# Patient Record
Sex: Male | Born: 1970 | Race: Black or African American | Hispanic: No | Marital: Married | Smoking: Never smoker
Health system: Southern US, Community
[De-identification: ages and names within clinical notes are randomized; demographics above are authoritative.]

## PROBLEM LIST (undated history)

## (undated) DIAGNOSIS — I10 Essential (primary) hypertension: Secondary | ICD-10-CM

## (undated) HISTORY — PX: KNEE SURGERY: SHX244

---

## 2014-08-09 ENCOUNTER — Encounter (HOSPITAL_COMMUNITY): Payer: Self-pay | Admitting: Emergency Medicine

## 2014-08-09 ENCOUNTER — Emergency Department (HOSPITAL_COMMUNITY): Payer: Self-pay

## 2014-08-09 ENCOUNTER — Emergency Department (HOSPITAL_COMMUNITY)
Admission: EM | Admit: 2014-08-09 | Discharge: 2014-08-09 | Disposition: A | Discharge status: Home / Self Care | Payer: Self-pay | Attending: Emergency Medicine | Admitting: Emergency Medicine

## 2014-08-09 DIAGNOSIS — S61335A Puncture wound without foreign body of left ring finger with damage to nail, initial encounter: Secondary | ICD-10-CM | POA: Insufficient documentation

## 2014-08-09 DIAGNOSIS — S62609B Fracture of unspecified phalanx of unspecified finger, initial encounter for open fracture: Secondary | ICD-10-CM

## 2014-08-09 DIAGNOSIS — Y9389 Activity, other specified: Secondary | ICD-10-CM | POA: Insufficient documentation

## 2014-08-09 DIAGNOSIS — S62637B Displaced fracture of distal phalanx of left little finger, initial encounter for open fracture: Secondary | ICD-10-CM | POA: Insufficient documentation

## 2014-08-09 DIAGNOSIS — Y998 Other external cause status: Secondary | ICD-10-CM | POA: Insufficient documentation

## 2014-08-09 DIAGNOSIS — I10 Essential (primary) hypertension: Secondary | ICD-10-CM | POA: Insufficient documentation

## 2014-08-09 DIAGNOSIS — W298XXA Contact with other powered powered hand tools and household machinery, initial encounter: Secondary | ICD-10-CM | POA: Insufficient documentation

## 2014-08-09 DIAGNOSIS — Y9289 Other specified places as the place of occurrence of the external cause: Secondary | ICD-10-CM | POA: Insufficient documentation

## 2014-08-09 DIAGNOSIS — IMO0002 Reserved for concepts with insufficient information to code with codable children: Secondary | ICD-10-CM

## 2014-08-09 HISTORY — DX: Essential (primary) hypertension: I10

## 2014-08-09 MED ORDER — HYDROCODONE-ACETAMINOPHEN 5-325 MG PO TABS: 2.0000 | ORAL_TABLET | ORAL | Status: AC | PRN

## 2014-08-09 MED ORDER — CEPHALEXIN 500 MG PO CAPS
500.0000 mg | ORAL_CAPSULE | Freq: Once | ORAL | Status: AC
  Administered 2014-08-09: 500 mg via ORAL
  Filled 2014-08-09: qty 1

## 2014-08-09 MED ORDER — LIDOCAINE HCL (PF) 1 % IJ SOLN
INTRAMUSCULAR | Status: AC
  Filled 2014-08-09: qty 10

## 2014-08-09 MED ORDER — CEPHALEXIN 500 MG PO CAPS: 500.0000 mg | ORAL_CAPSULE | Freq: Four times a day (QID) | ORAL | Status: AC

## 2014-08-09 MED ORDER — LIDOCAINE HCL (PF) 1 % IJ SOLN
INTRAMUSCULAR | Status: AC
  Filled 2014-08-09: qty 5

## 2014-08-09 NOTE — ED Notes (Signed)
PT obtained a laceration to right hand 4th and 5th digit with bleeding controlled at this time. PT states has had tetanus shot within the past 5 years.

## 2014-08-09 NOTE — Discharge Instructions (Signed)
Call Dr. Hilda LiasKeeling for follow up appointment. If unable to be seen, return to ER in 10 days.   Finger Fracture Fractures of fingers are breaks in the bones of the fingers. There are many types of fractures. There are different ways of treating these fractures. Your health care provider will discuss the best way to treat your fracture. CAUSES Traumatic injury is the main cause of broken fingers. These include:  Injuries while playing sports.  Workplace injuries.  Falls. RISK FACTORS Activities that can increase your risk of finger fractures include:  Sports.  Workplace activities that involve machinery.  A condition called osteoporosis, which can make your bones less dense and cause them to fracture more easily. SIGNS AND SYMPTOMS The main symptoms of a broken finger are pain and swelling within 15 minutes after the injury. Other symptoms include:  Bruising of your finger.  Stiffness of your finger.  Numbness of your finger.  Exposed bones (compound fracture) if the fracture is severe. DIAGNOSIS  The best way to diagnose a broken bone is with X-ray imaging. Additionally, your health care provider will use this X-ray image to evaluate the position of the broken finger bones.  TREATMENT  Finger fractures can be treated with:   Nonreduction--This means the bones are in place. The finger is splinted without changing the positions of the bone pieces. The splint is usually left on for about a week to 10 days. This will depend on your fracture and what your health care provider thinks.  Closed reduction--The bones are put back into position without using surgery. The finger is then splinted.  Open reduction and internal fixation--The fracture site is opened. Then the bone pieces are fixed into place with pins or some type of hardware. This is seldom required. It depends on the severity of the fracture. HOME CARE INSTRUCTIONS   Follow your health care provider's instructions regarding  activities, exercises, and physical therapy.  Only take over-the-counter or prescription medicines for pain, discomfort, or fever as directed by your health care provider. SEEK MEDICAL CARE IF: You have pain or swelling that limits the motion or use of your fingers. SEEK IMMEDIATE MEDICAL CARE IF:  Your finger becomes numb. MAKE SURE YOU:   Understand these instructions.  Will watch your condition.  Will get help right away if you are not doing well or get worse. Document Released: 12/06/2000 Document Revised: 06/14/2013 Document Reviewed: 04/05/2013 [facility] Patient Information 2015 [place]. This information is not intended to replace advice given to you by your health care provider. Make sure you discuss any questions you have with your health care provider.  Laceration Care, Adult A laceration is a cut or lesion that goes through all layers of the skin and into the tissue just beneath the skin. TREATMENT  Some lacerations may not require closure. Some lacerations may not be able to be closed due to an increased risk of infection. It is important to see your caregiver as soon as possible after an injury to minimize the risk of infection and maximize the opportunity for successful closure. If closure is appropriate, pain medicines may be given, if needed. The wound will be cleaned to help prevent infection. Your caregiver will use stitches (sutures), staples, wound glue (adhesive), or skin adhesive strips to repair the laceration. These tools bring the skin edges together to allow for faster healing and a better cosmetic outcome. However, all wounds will heal with a scar. Once the wound has healed, scarring can be minimized by  covering the wound with sunscreen during the day for 1 full year. HOME CARE INSTRUCTIONS  For sutures or staples:  Keep the wound clean and dry.  If you were given a bandage (dressing), you should change it at least once a day. Also, change the dressing if  it becomes wet or dirty, or as directed by your caregiver.  Wash the wound with soap and water 2 times a day. Rinse the wound off with water to remove all soap. Pat the wound dry with a clean towel.  After cleaning, apply a thin layer of the antibiotic ointment as recommended by your caregiver. This will help prevent infection and keep the dressing from sticking.  You may shower as usual after the first 24 hours. Do not soak the wound in water until the sutures are removed.  Only take over-the-counter or prescription medicines for pain, discomfort, or fever as directed by your caregiver.  Get your sutures or staples removed as directed by your caregiver. For skin adhesive strips:  Keep the wound clean and dry.  Do not get the skin adhesive strips wet. You may bathe carefully, using caution to keep the wound dry.  If the wound gets wet, pat it dry with a clean towel.  Skin adhesive strips will fall off on their own. You may trim the strips as the wound heals. Do not remove skin adhesive strips that are still stuck to the wound. They will fall off in time. For wound adhesive:  You may briefly wet your wound in the shower or bath. Do not soak or scrub the wound. Do not swim. Avoid periods of heavy perspiration until the skin adhesive has fallen off on its own. After showering or bathing, gently pat the wound dry with a clean towel.  Do not apply liquid medicine, cream medicine, or ointment medicine to your wound while the skin adhesive is in place. This may loosen the film before your wound is healed.  If a dressing is placed over the wound, be careful not to apply tape directly over the skin adhesive. This may cause the adhesive to be pulled off before the wound is healed.  Avoid prolonged exposure to sunlight or tanning lamps while the skin adhesive is in place. Exposure to ultraviolet light in the first year will darken the scar.  The skin adhesive will usually remain in place for 5 to  10 days, then naturally fall off the skin. Do not pick at the adhesive film. You may need a tetanus shot if:  You cannot remember when you had your last tetanus shot.  You have never had a tetanus shot. If you get a tetanus shot, your arm may swell, get red, and feel warm to the touch. This is common and not a problem. If you need a tetanus shot and you choose not to have one, there is a rare chance of getting tetanus. Sickness from tetanus can be serious. SEEK MEDICAL CARE IF:   You have redness, swelling, or increasing pain in the wound.  You see a red line that goes away from the wound.  You have yellowish-white fluid (pus) coming from the wound.  You have a fever.  You notice a bad smell coming from the wound or dressing.  Your wound breaks open before or after sutures have been removed.  You notice something coming out of the wound such as wood or glass.  Your wound is on your hand or foot and you cannot move a finger  or toe. SEEK IMMEDIATE MEDICAL CARE IF:   Your pain is not controlled with prescribed medicine.  You have severe swelling around the wound causing pain and numbness or a change in color in your arm, hand, leg, or foot.  Your wound splits open and starts bleeding.  You have worsening numbness, weakness, or loss of function of any joint around or beyond the wound.  You develop painful lumps near the wound or on the skin anywhere on your body. MAKE SURE YOU:   Understand these instructions.  Will watch your condition.  Will get help right away if you are not doing well or get worse. Document Released: 08/24/2005 Document Revised: 11/16/2011 Document Reviewed: 02/17/2011 [facility] Patient Information 2015 [place], [place]. This information is not intended to replace advice given to you by your health care provider. Make sure you discuss any questions you have with your health care provider.

## 2014-08-09 NOTE — ED Notes (Signed)
Patient with no complaints at this time. Respirations even and unlabored. Skin warm/dry. Discharge instructions reviewed with patient at this time. Patient given opportunity to voice concerns/ask questions. Patient discharged at this time and left Emergency Department with steady gait.   

## 2014-08-09 NOTE — ED Provider Notes (Signed)
CSN: [id number]     Arrival date & time 08/09/14  1511 History  This chart was scribed for Rolland PorterMark Shardae Kleinman, MD by Annye AsaAnna Dorsett, ED Scribe. This patient was seen in room APA19/APA19 and the patient's care was started at 3:35 PM.    Chief Complaint  Patient presents with  . Extremity Laceration   The history is provided by the patient. No language interpreter was used.     HPI Comments: David Campos is a 43 y.o. male who is right hand dominant presents to the Emergency Department complaining of a laceration to the left fourth and fifth digit. Bleeding is controlled at present. He explains that he was utilizing a hand held drill with his right hand when he accidentally caught his left hand under the edge of the drill and "snatched it away."  Patient reports that his last TDP was in 2012.   Past Medical History  Diagnosis Date  . Hypertension    Past Surgical History  Procedure Laterality Date  . Knee surgery     No family history on file. History  Substance Use Topics  . Smoking status: Never Smoker   . Smokeless tobacco: Not on file  . Alcohol Use: Yes     Comment: beer daily    Review of Systems  Skin: Positive for wound (left fourth and fifth fingers).    Allergies  Review of patient's allergies indicates no known allergies.  Home Medications   Prior to Admission medications   Not on File   BP 194/124 mmHg  Pulse 76  Temp(Src) 99 F (37.2 C) (Oral)  Resp 18  Ht 5\' 11"  (1.803 m)  Wt 240 lb (108.863 kg)  BMI 33.49 kg/m2  SpO2 97% Physical Exam  Constitutional: He is oriented to person, place, and time. He appears well-developed and well-nourished.  HENT:  Head: Normocephalic and atraumatic.  Neck: No tracheal deviation present.  Cardiovascular: Normal rate.   Pulmonary/Chest: Effort normal.  Neurological: He is alert and oriented to person, place, and time.  Skin: Skin is warm and dry.  Decreased sensation to the tip of the left small finger Puncture to the  nail of the left ring finger; motion and sensation intact Jagged laceration to the left fifth radial DIP; FDP intact, flexion intact  Psychiatric: He has a normal mood and affect. His behavior is normal.  Nursing note and vitals reviewed.   ED Course  LACERATION REPAIR Date/Time: 08/09/2014 5:24 PM Performed by: Rolland PorterJAMES, Shamyia Grandpre Authorized by: Rolland PorterJAMES, Mischell Branford Consent: Verbal consent obtained. Written consent not obtained. Consent given by: patient Patient understanding: patient states understanding of the procedure being performed Patient identity confirmed: verbally with patient Body area: upper extremity Location details: left small finger Laceration length: 3 cm Foreign body present: cancellous bone fragment in nonviable soft tissues debrieded. Tendon involvement: none Nerve involvement: none Vascular damage: no Anesthesia: digital block Local anesthetic: lidocaine 1% without epinephrine Anesthetic total: 6 ml Patient sedated: no Irrigation solution: saline Irrigation method: syringe Amount of cleaning: 1 liter. Debridement: minimal (1x202mm piece cancellous bone) Degree of undermining: none Skin closure: 4-0 nylon Number of sutures: 12 Technique: simple Approximation difficulty: complex Dressing: 4x4 sterile gauze Patient tolerance: Patient tolerated the procedure well with no immediate complications     DIAGNOSTIC STUDIES: Oxygen Saturation is 97% on RA, adequate by my interpretation.    COORDINATION OF CARE: 3:38 PM Discussed treatment plan with pt at bedside and pt agreed to plan.    Labs Review Labs Reviewed - No  data to display  Imaging Review No results found.   EKG Interpretation None      MDM   Final diagnoses:  Laceration   X rays noted.  After anesthesia, pt 5th digit does not show instability at PIP to suggest bony avulsion of radial-collateral PIP ligament.  Intact tendon function. Slight decreased sensation to 2 point to radial lateral P3.   I  personally performed the services described in this documentation, which was scribed in my presence. The recorded information has been reviewed and is accurate.    EKG Interpretation None          Rolland PorterMark Edwina Grossberg, MD 08/09/14 1732

## 2014-08-09 NOTE — ED Notes (Signed)
Wound dressed with sterile non-adherent dressing and secured with tube gauze and kerlex. Wound care instructions as well as follow up care given.

## 2015-09-07 IMAGING — CR DG HAND COMPLETE 3+V*L*
3 series · 3 of 3 positions shown · non-contrast
Comparison: None.

CLINICAL DATA: Lacerations to the ring and little fingers.

EXAM:
LEFT HAND - COMPLETE 3+ VIEW

[view not recorded (1 of 3)]
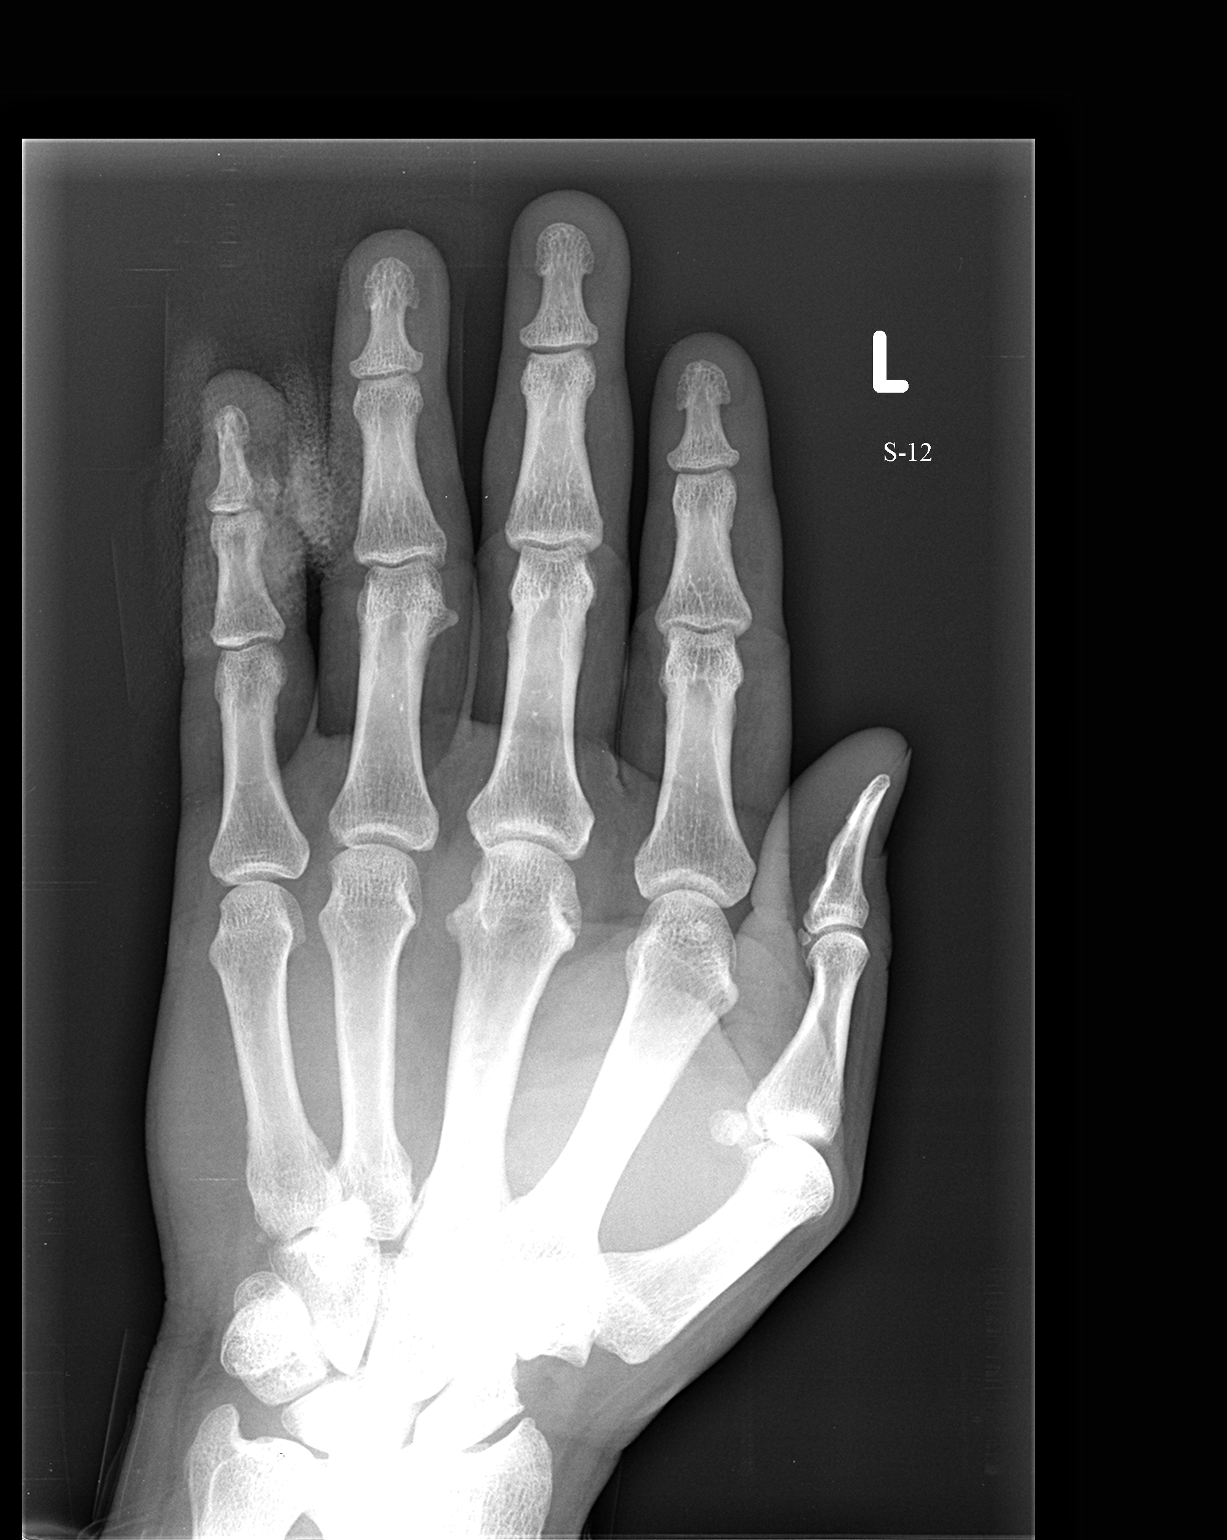

[view not recorded (2 of 3)]
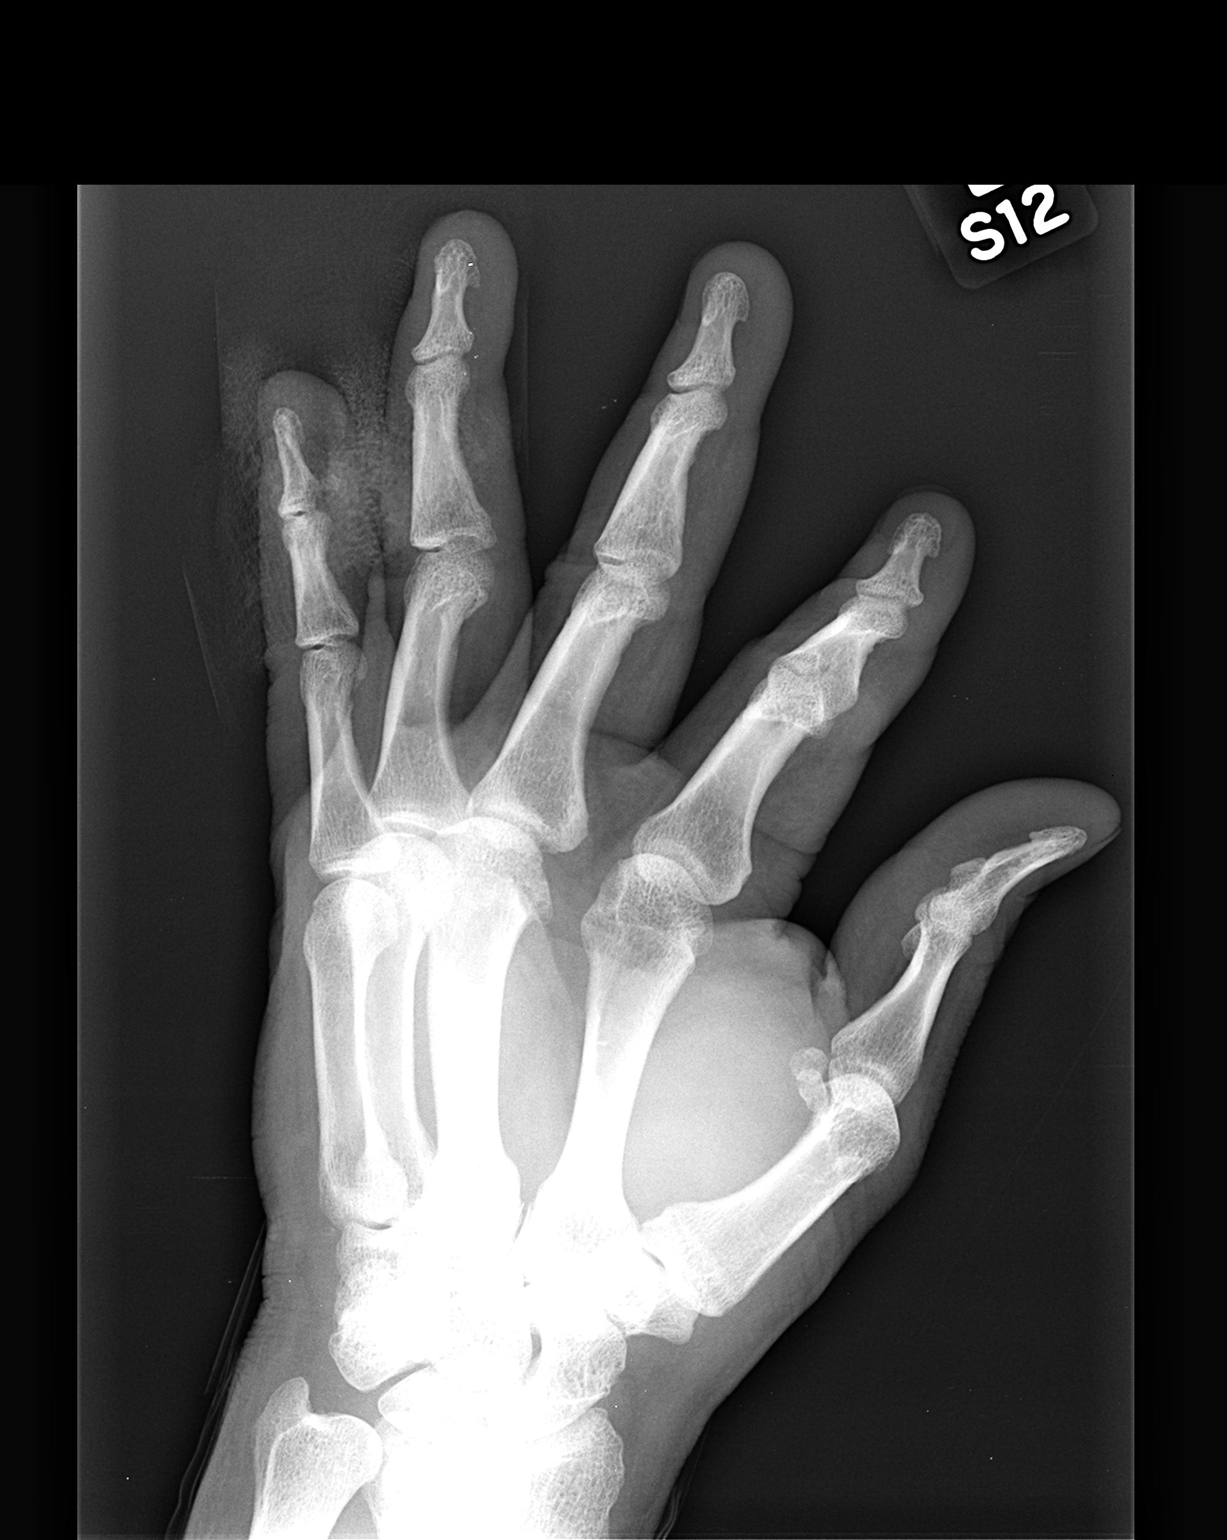

[view not recorded (3 of 3)]
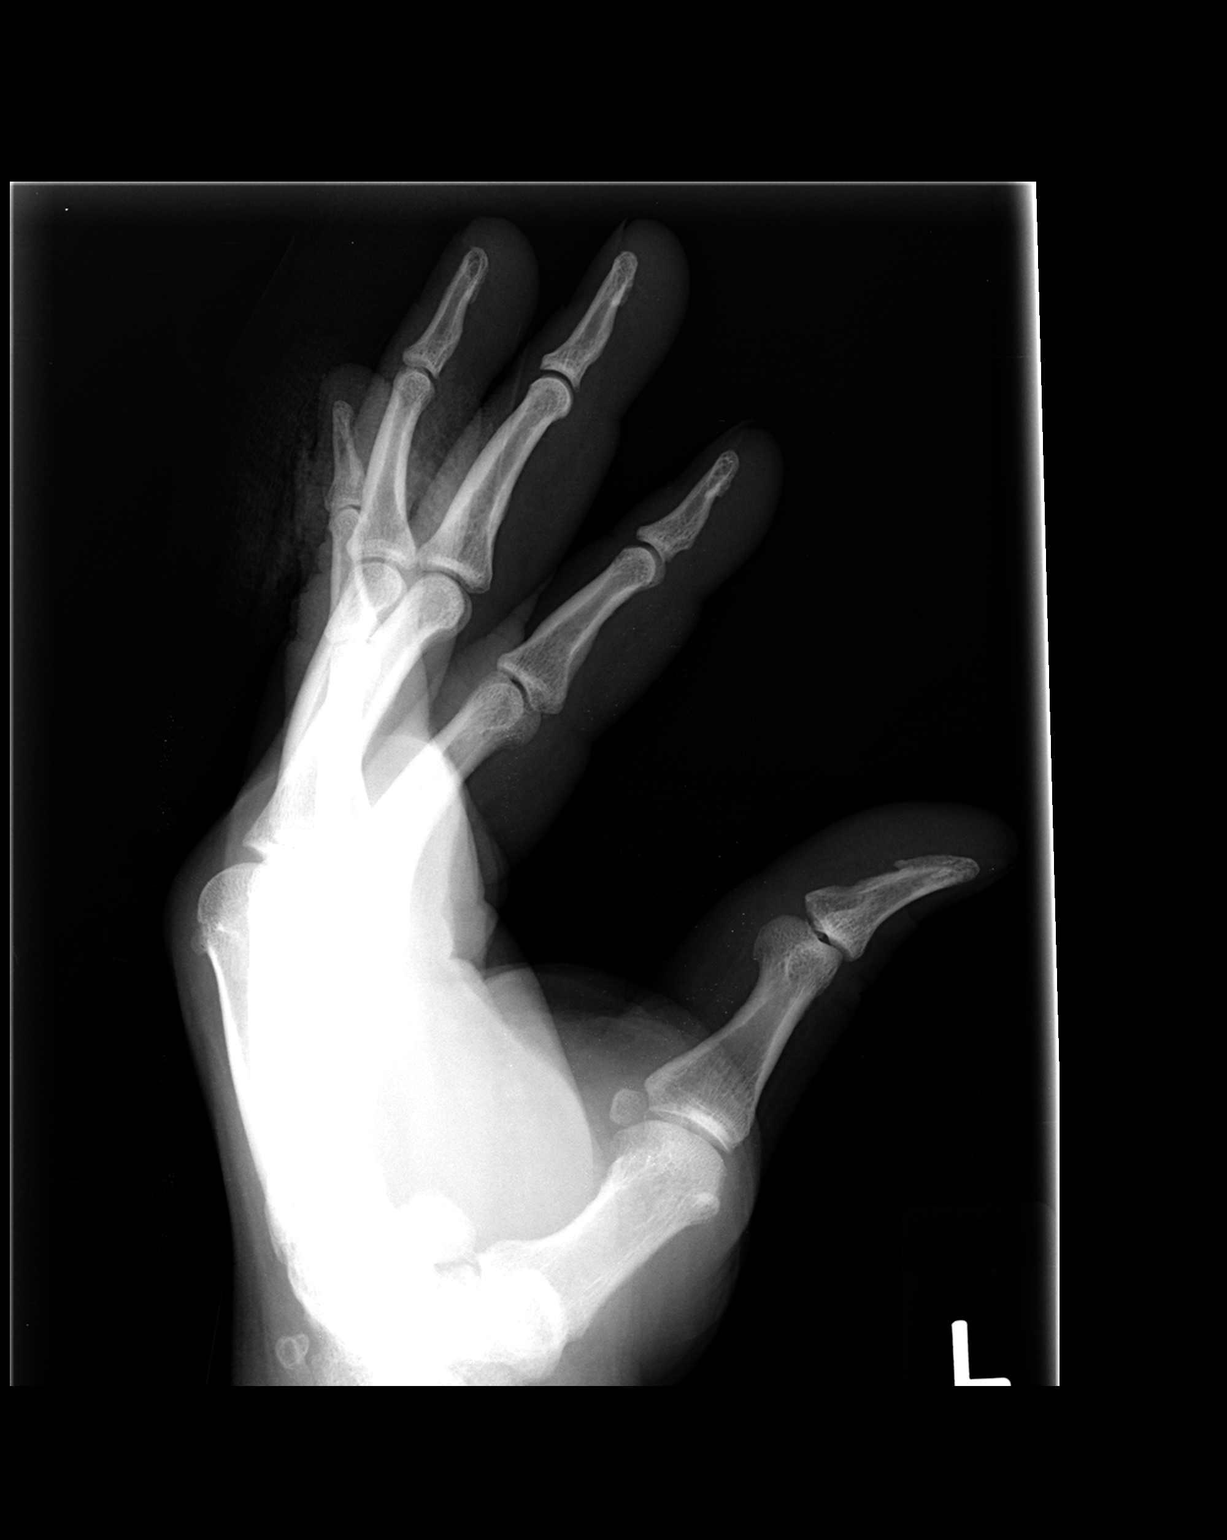

[3 of 3 positions shown; findings below may reference images not displayed]

FINDINGS: There is a comminuted avulsion fracture of the radial aspect of the
base of the distal phalanx of the little finger There is no acute
osseous abnormality of the ring finger. There is a small exostosis
of the distal aspect of the proximal phalanx of the ring finger.

There is slight degenerative changes between the scaphoid and
trapezium and trapezoid and at the first carpal metacarpal joint.
There are minimal osteoarthritic changes of the DIP joints of the
fingers.
IMPRESSION: Acute comminuted avulsion fracture of the distal phalanx of the
little finger.

## 2018-12-07 DEATH — deceased
